# Patient Record
Sex: Male | Born: 1950 | Race: White | Hispanic: No | Marital: Married | Smoking: Former smoker
Health system: Southern US, Community
[De-identification: ages and names within clinical notes are randomized; demographics above are authoritative.]

## PROBLEM LIST (undated history)

## (undated) DIAGNOSIS — J45909 Unspecified asthma, uncomplicated: Secondary | ICD-10-CM

## (undated) DIAGNOSIS — E119 Type 2 diabetes mellitus without complications: Secondary | ICD-10-CM

## (undated) HISTORY — DX: Type 2 diabetes mellitus without complications: E11.9

## (undated) HISTORY — PX: PALATE / UVULA BIOPSY / EXCISION: SUR128

## (undated) HISTORY — DX: Unspecified asthma, uncomplicated: J45.909

---

## 1998-05-26 ENCOUNTER — Ambulatory Visit (HOSPITAL_COMMUNITY): Admission: RE | Admit: 1998-05-26 | Discharge: 1998-05-26 | Payer: Self-pay | Admitting: Gastroenterology

## 1999-06-26 ENCOUNTER — Encounter: Payer: Self-pay | Admitting: Dermatology

## 1999-06-26 ENCOUNTER — Ambulatory Visit (HOSPITAL_COMMUNITY): Admission: RE | Admit: 1999-06-26 | Discharge: 1999-06-26 | Payer: Self-pay | Admitting: Dermatology

## 2001-09-01 ENCOUNTER — Encounter: Admission: RE | Admit: 2001-09-01 | Discharge: 2001-09-01 | Payer: Self-pay

## 2013-11-05 ENCOUNTER — Ambulatory Visit (INDEPENDENT_AMBULATORY_CARE_PROVIDER_SITE_OTHER): Payer: Worker's Compensation | Admitting: Internal Medicine

## 2013-11-05 ENCOUNTER — Other Ambulatory Visit (INDEPENDENT_AMBULATORY_CARE_PROVIDER_SITE_OTHER): Payer: Worker's Compensation

## 2013-11-05 ENCOUNTER — Encounter: Payer: Self-pay | Admitting: Internal Medicine

## 2013-11-05 ENCOUNTER — Ambulatory Visit (INDEPENDENT_AMBULATORY_CARE_PROVIDER_SITE_OTHER)
Admission: RE | Admit: 2013-11-05 | Discharge: 2013-11-05 | Disposition: A | Discharge status: Home / Self Care | Payer: Worker's Compensation | Source: Ambulatory Visit | Attending: Internal Medicine | Admitting: Internal Medicine

## 2013-11-05 VITALS — BP 130/80 | HR 60 | Temp 98.2°F | Ht 73.0 in | Wt 250.0 lb

## 2013-11-05 DIAGNOSIS — J942 Hemothorax: Secondary | ICD-10-CM

## 2013-11-05 DIAGNOSIS — J90 Pleural effusion, not elsewhere classified: Secondary | ICD-10-CM

## 2013-11-05 LAB — CBC WITH DIFFERENTIAL/PLATELET
Basophils Absolute: 0 10*3/uL (ref 0.0–0.1)
Basophils Relative: 0.3 % (ref 0.0–3.0)
EOS ABS: 0.1 10*3/uL (ref 0.0–0.7)
Eosinophils Relative: 1.1 % (ref 0.0–5.0)
HCT: 42.8 % (ref 39.0–52.0)
Hemoglobin: 14.2 g/dL (ref 13.0–17.0)
Lymphocytes Relative: 13.8 % (ref 12.0–46.0)
Lymphs Abs: 1.4 10*3/uL (ref 0.7–4.0)
MCHC: 33.1 g/dL (ref 30.0–36.0)
MCV: 94.8 fl (ref 78.0–100.0)
MONO ABS: 0.8 10*3/uL (ref 0.1–1.0)
Monocytes Relative: 8.1 % (ref 3.0–12.0)
Neutro Abs: 7.6 10*3/uL (ref 1.4–7.7)
Neutrophils Relative %: 76.7 % (ref 43.0–77.0)
PLATELETS: 226 10*3/uL (ref 150.0–400.0)
RBC: 4.52 Mil/uL (ref 4.22–5.81)
RDW: 13.8 % (ref 11.5–14.6)
WBC: 9.9 10*3/uL (ref 4.5–10.5)

## 2013-11-05 LAB — HEPATIC FUNCTION PANEL
ALT: 26 U/L (ref 0–53)
AST: 26 U/L (ref 0–37)
Albumin: 4.5 g/dL (ref 3.5–5.2)
Alkaline Phosphatase: 79 U/L (ref 39–117)
BILIRUBIN TOTAL: 0.6 mg/dL (ref 0.3–1.2)
Bilirubin, Direct: 0.1 mg/dL (ref 0.0–0.3)
Total Protein: 8.4 g/dL — ABNORMAL HIGH (ref 6.0–8.3)

## 2013-11-05 LAB — BASIC METABOLIC PANEL
BUN: 15 mg/dL (ref 6–23)
CO2: 25 mEq/L (ref 19–32)
Calcium: 9.8 mg/dL (ref 8.4–10.5)
Chloride: 102 mEq/L (ref 96–112)
Creatinine, Ser: 1 mg/dL (ref 0.4–1.5)
GFR: 83.14 mL/min (ref >60.00)
Glucose, Bld: 109 mg/dL — ABNORMAL HIGH (ref 70–99)
POTASSIUM: 4.2 meq/L (ref 3.5–5.1)
SODIUM: 140 meq/L (ref 135–145)

## 2013-11-05 LAB — SEDIMENTATION RATE: Sed Rate: 33 mm/hr — ABNORMAL HIGH (ref 0–22)

## 2013-11-05 NOTE — Progress Notes (Signed)
Subjective:    Patient ID: Kyle PoloDavid L Trent, male    DOB: 1951-06-27   MRN: [id number]  HPI  [age] yowm quit smoking 1989 with prev w/u by [facility] 2008 for ? MPN "benign" referred 11/05/2013 to pulmonary eval for R Effusion p Trauma to R chest with rib fx's 09/21/13   11/05/2013 1st [facility] office visit/ Liliann File  Chief Complaint  Patient presents with  . Pulmonary Consult    Referred per Norberta KeensJonathan Yoder. Worker's Comp case. Pt had fall at work 09/20/13 and was then found to have pleural effusion.      Admitted [facility] 09/21/13 - 09/23/13 with dx of rib fx and no need for tube/ drainage with constant ache on R side assoc with numb sensation C shape along around T6/ 8 dermatome,  Pain is constant 4/5 slt worse deep breath and worse supine so sleeps 45 degrees due to pain but no smothering and only mild doe. Was a 10/10 now a 4/10 and not using any narcotics, just advil to control it. Was told the pain was coming from the effusion by a neurosurgeon  No obvious day to day or daytime variabilty or assoc chronic cough or   chest tightness, subjective wheeze overt sinus or hb symptoms. No unusual exp hx or h/o childhood pna/ asthma or knowledge of premature birth.  Sleeping ok in recliner without nocturnal  or early am exacerbation  of respiratory  c/o's or need for noct saba. Also denies any obvious fluctuation of symptoms with weather or environmental changes or other aggravating or alleviating factors except as outlined above   Current Medications, Allergies, Complete Past Medical History, Past Surgical History, Family History, and Social History were reviewed in [facility] electronic medical record.       Review of Systems  Constitutional: Negative for fever, chills, activity change, appetite change and unexpected weight change.  HENT: Negative for congestion, dental problem, postnasal drip, rhinorrhea, sneezing, sore throat, trouble swallowing and voice change.   Eyes: Negative  for visual disturbance.  Respiratory: Negative for cough, choking and shortness of breath.   Cardiovascular: Negative for chest pain and leg swelling.  Gastrointestinal: Negative for nausea, vomiting and abdominal pain.  Genitourinary: Negative for difficulty urinating.  Musculoskeletal: Negative for arthralgias.  Skin: Negative for rash.  Psychiatric/Behavioral: Negative for behavioral problems and confusion.       Objective:   Physical Exam  Wt Readings from Last 3 Encounters:  11/05/13 250 lb (113.399 kg)      HEENT: nl dentition, turbinates, and orophanx. Nl external ear canals without cough reflex   NECK :  without JVD/Nodes/TM/ nl carotid upstrokes bilaterally   LUNGS: no acc muscle use,  Dullness with decreased bs at R base, no bruise or obvious rib instability   CV:  RRR  no s3 or murmur or increase in P2, no edema   ABD:  soft and nontender with nl excursion in the supine position. No bruits or organomegaly, bowel sounds nl  MS:  warm without deformities, calf tenderness, cyanosis or clubbing  SKIN: warm and dry without lesions    NEURO:  alert, approp, no deficits    CXR  11/05/2013 :  Multiple right-sided rib fractures are noted associated with an  adjacent right pleural effusion. No evidence of pneumothorax.  labs  Lab Results  Component Value Date   HGB 14.2 11/05/2013    Lab Results  Component Value Date   ESRSEDRATE 33* 11/05/2013  Assessment & Plan:

## 2013-11-05 NOTE — Patient Instructions (Addendum)
zostrix cream 4 x daily until better then apply at bedtime  You will not be able work until this problem   Try advil 3 with meals as needed for pain   Please remember to go to the lab and x-ray department downstairs for your tests - we will call you with the results when they are available.

## 2013-11-06 NOTE — Assessment & Plan Note (Addendum)
-    Esr 33 11/05/13 with hgb 14  Theeffusion is relatively small and likely mostly gelatinous (clotted blood)  by this point 6 weeks p the injury with no evidence of a "dressler's like" PCIS pattern so thoracentesis would be low yield and a chest tube or VATS risk/cost vs benefit very low and would cause additional rib pain, his main complaint at present   Therefore rx is nsaids and zostrix for the cp which is clearly in a "dermatomal" pattern typical of a R  rib injury and assoc with characteristics c/w neuropathic pain   so trial of zostrix then perhaps neurontin or lyrica is indicated.

## 2013-11-08 ENCOUNTER — Telehealth: Payer: Self-pay | Admitting: Internal Medicine

## 2013-11-08 NOTE — Telephone Encounter (Signed)
Discuss cxr result per MW and rec's with pt.  He verbalized understanding and stated he is still having pain. Advised him if pain continued and did not ease up, to come back before 1 month and repeat cxr. Nothing further is needed at this time

## 2015-06-19 IMAGING — CR DG CHEST 2V
2 series · 2 of 2 positions shown · non-contrast
Comparison: None.

CLINICAL DATA: Follow up injury. Multiple rib fractures and right
hemothorax with persistent right-sided pain.

EXAM:
CHEST  2 VIEW

[view not recorded (1 of 2)]
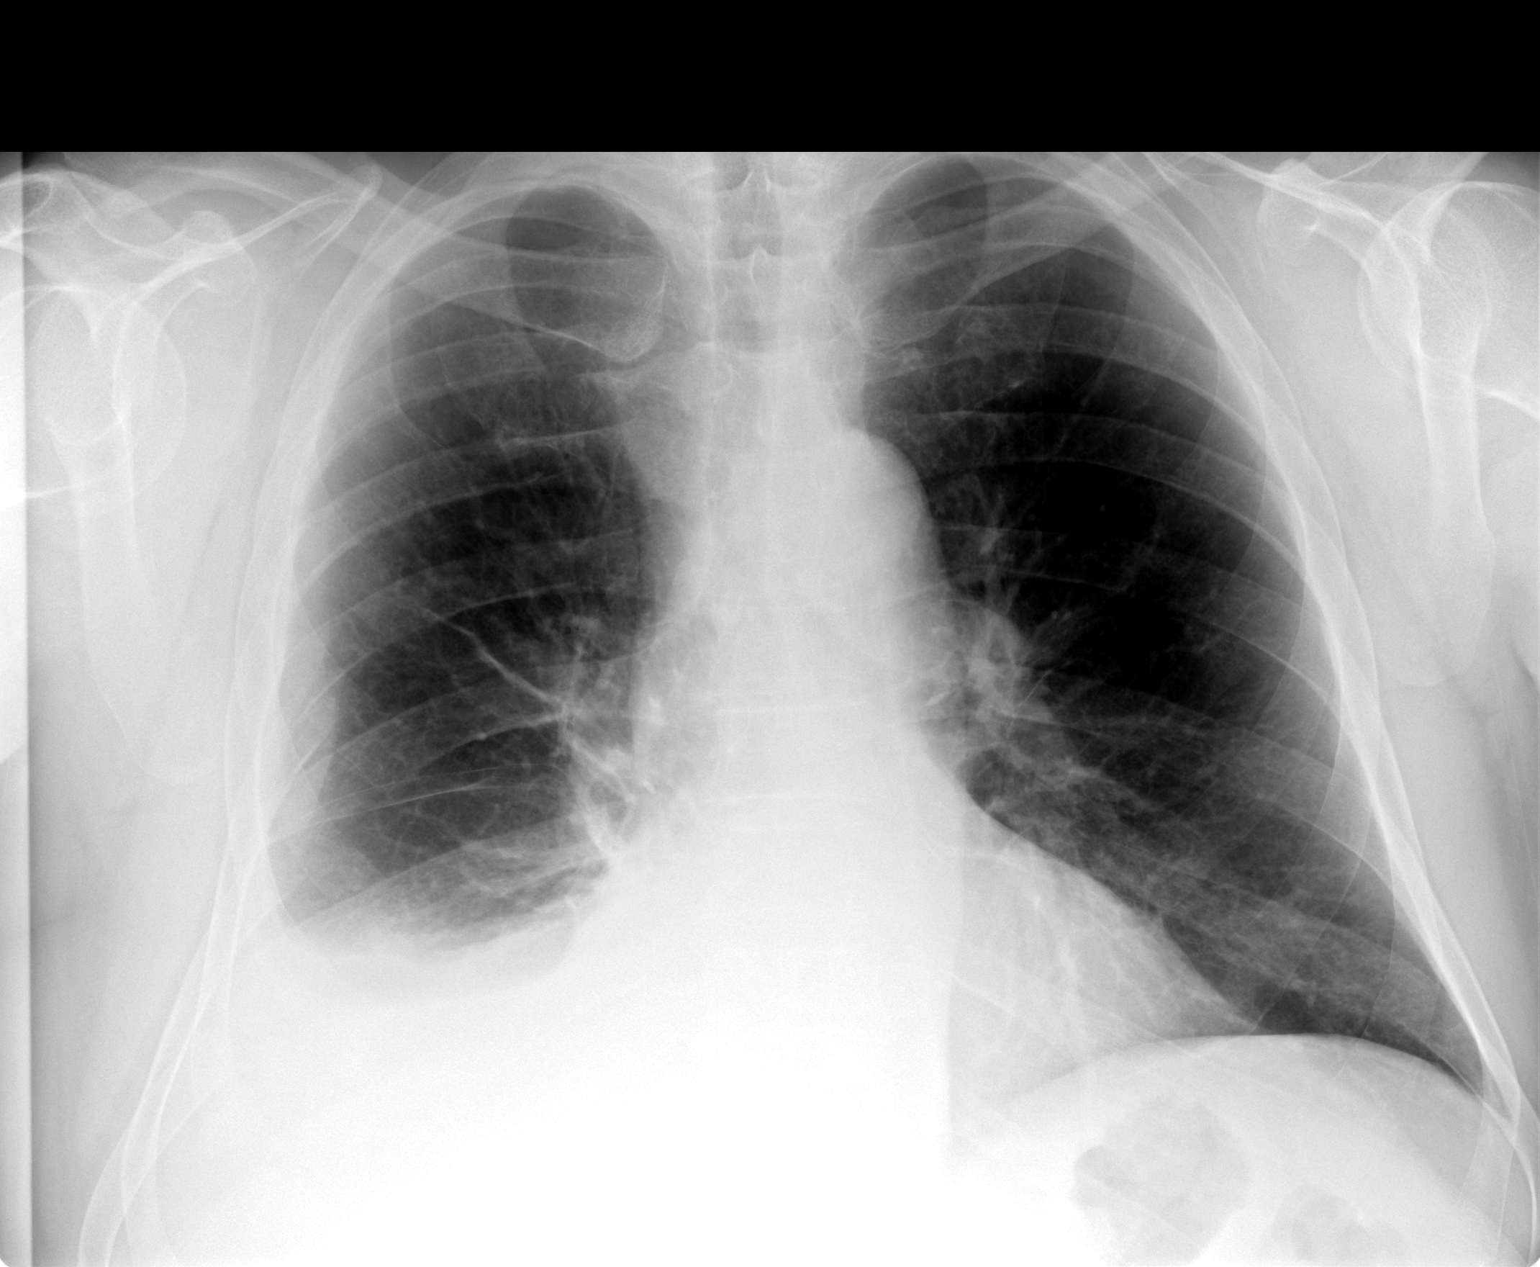

[view not recorded (2 of 2)]
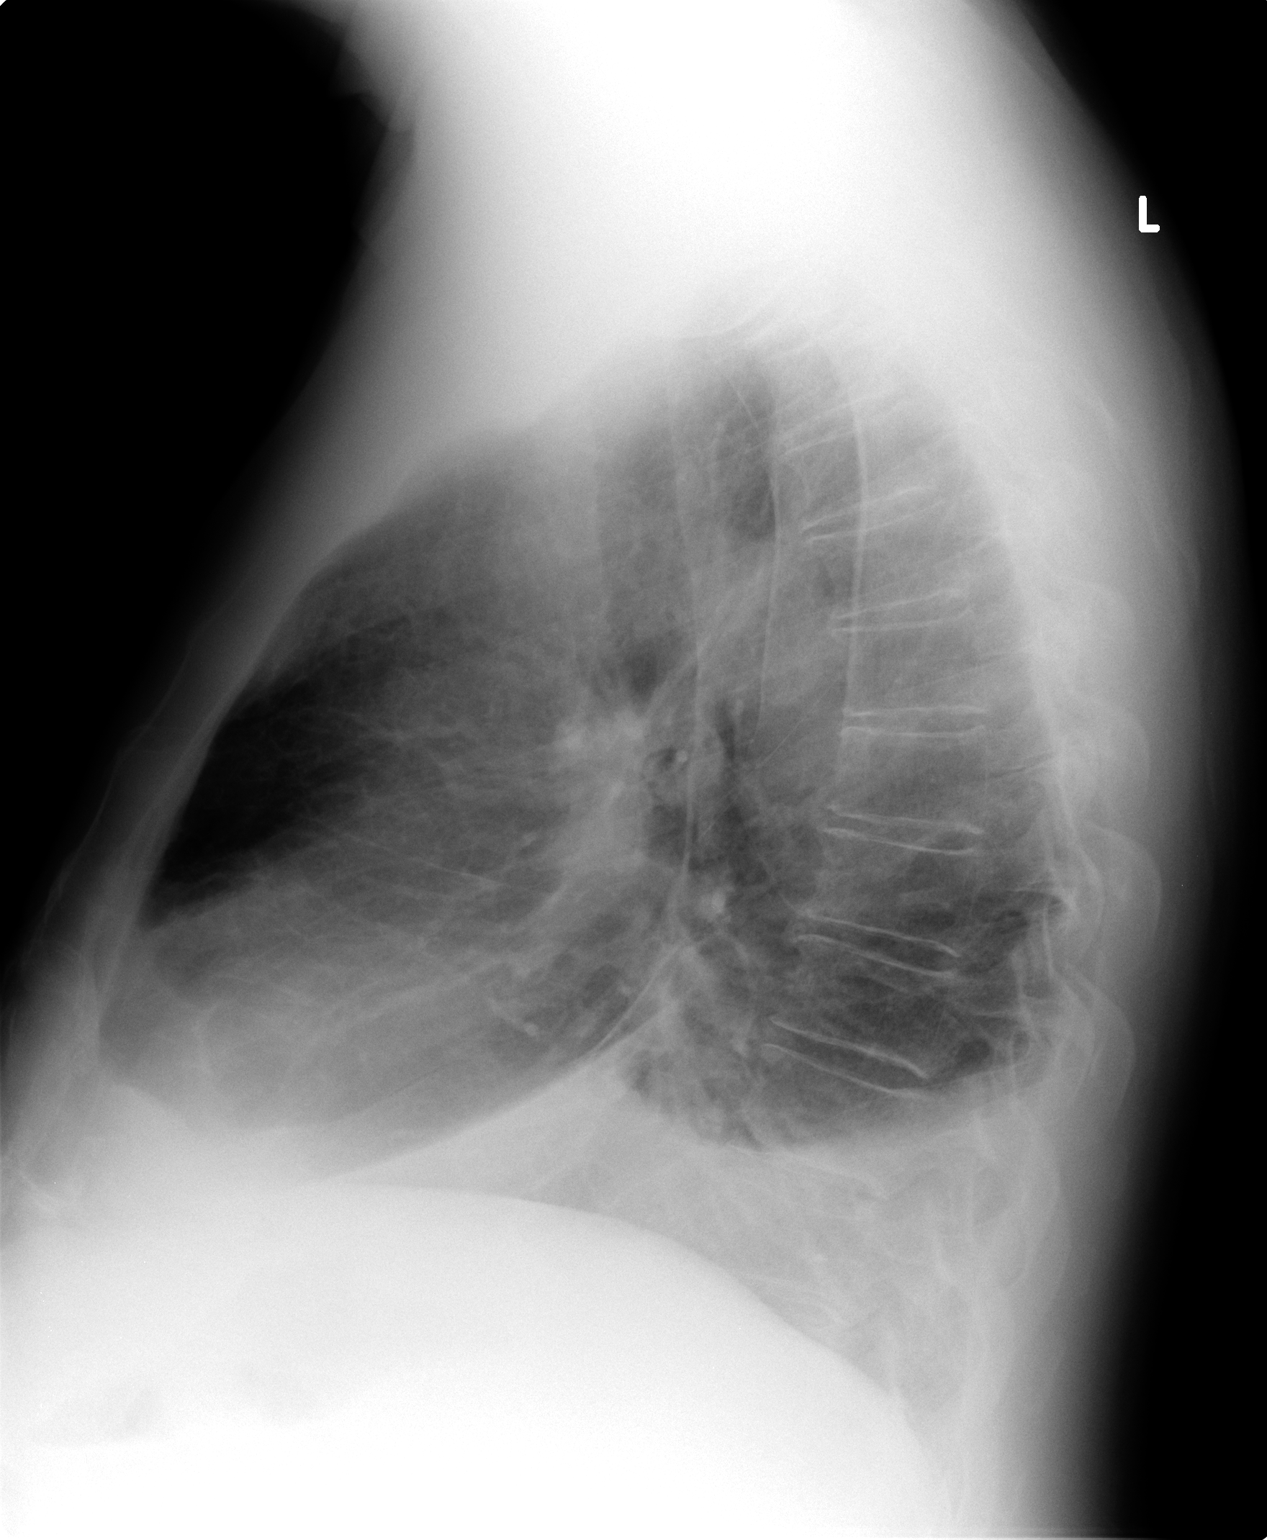

[2 of 2 positions shown; findings below may reference images not displayed]

FINDINGS: Mildly displaced fractures of the right fifth through ninth ribs are
visualized laterally on the frontal examination. There is a small
right-sided pleural effusion which may be partially loculated
posterolaterally. No pneumothorax is identified. The lungs are
clear. The heart size and mediastinal contours are normal. No other
fractures are visualized.
IMPRESSION: Multiple right-sided rib fractures are noted associated with an
adjacent right pleural effusion. No evidence of pneumothorax.
